# Patient Record
Sex: Male | Born: 1970 | Race: White | Hispanic: No | Marital: Single | Smoking: Current every day smoker
Health system: Southern US, Community
[De-identification: ages and names within clinical notes are randomized; demographics above are authoritative.]

## PROBLEM LIST (undated history)

## (undated) DIAGNOSIS — Z720 Tobacco use: Secondary | ICD-10-CM

## (undated) DIAGNOSIS — K219 Gastro-esophageal reflux disease without esophagitis: Secondary | ICD-10-CM

## (undated) DIAGNOSIS — N400 Enlarged prostate without lower urinary tract symptoms: Secondary | ICD-10-CM

## (undated) DIAGNOSIS — K589 Irritable bowel syndrome without diarrhea: Secondary | ICD-10-CM

## (undated) HISTORY — DX: Irritable bowel syndrome, unspecified: K58.9

## (undated) HISTORY — DX: Irritable bowel syndrome without diarrhea: K58.9

## (undated) HISTORY — DX: Gastro-esophageal reflux disease without esophagitis: K21.9

## (undated) HISTORY — DX: Benign prostatic hyperplasia without lower urinary tract symptoms: N40.0

## (undated) HISTORY — DX: Tobacco use: Z72.0

---

## 1999-02-01 HISTORY — PX: ESOPHAGOGASTRODUODENOSCOPY: SHX1529

## 2009-09-29 ENCOUNTER — Ambulatory Visit: Payer: Self-pay | Admitting: Internal Medicine

## 2009-09-29 DIAGNOSIS — K219 Gastro-esophageal reflux disease without esophagitis: Secondary | ICD-10-CM

## 2009-09-29 DIAGNOSIS — K5900 Constipation, unspecified: Secondary | ICD-10-CM | POA: Insufficient documentation

## 2009-09-29 DIAGNOSIS — E669 Obesity, unspecified: Secondary | ICD-10-CM

## 2009-09-29 DIAGNOSIS — R351 Nocturia: Secondary | ICD-10-CM

## 2009-09-29 DIAGNOSIS — F17200 Nicotine dependence, unspecified, uncomplicated: Secondary | ICD-10-CM

## 2009-09-30 LAB — CONVERTED CEMR LAB
BUN: 20 mg/dL (ref 6–23)
CO2: 29 meq/L (ref 19–32)
Calcium: 9.6 mg/dL (ref 8.4–10.5)
Chloride: 105 meq/L (ref 96–112)
Cholesterol: 184 mg/dL (ref 0–200)
Creatinine, Ser: 1.2 mg/dL (ref 0.4–1.5)
GFR calc non Af Amer: 74.27 mL/min (ref >60)
Glucose, Bld: 92 mg/dL (ref 70–99)
HDL: 37.4 mg/dL — ABNORMAL LOW (ref >39.00)
LDL Cholesterol: 129 mg/dL — ABNORMAL HIGH (ref 0–99)
PSA: 1.7 ng/mL (ref 0.10–4.00)
Potassium: 5 meq/L (ref 3.5–5.1)
Sodium: 141 meq/L (ref 135–145)
TSH: 0.67 u[IU]/mL (ref 0.35–5.50)
Total CHOL/HDL Ratio: 5
Triglycerides: 89 mg/dL (ref 0.0–149.0)
VLDL: 17.8 mg/dL (ref 0.0–40.0)

## 2009-11-11 ENCOUNTER — Ambulatory Visit: Payer: Self-pay | Admitting: Internal Medicine

## 2009-11-11 DIAGNOSIS — R0602 Shortness of breath: Secondary | ICD-10-CM

## 2010-03-02 NOTE — Assessment & Plan Note (Signed)
Summary: FOLLOW UP / LFW   Vital Signs:  Patient profile:   40 year old male Weight:      253 pounds Temp:     98.8 degrees F oral Pulse rate:   76 / minute Pulse rhythm:   regular BP sitting:   126 / 70  (left arm) Cuff size:   large  Vitals Entered By: Selena Batten Dance CMA Duncan Dull) (November 11, 2009 9:16 AM) CC: 2 month follow up   History of Present Illness: CC: 2 mo f/u.  reviewed blood work.  1. bowel - still having episodes of constipation for 2-3 days followed by 1 day of diarrhea with cramping and abd pain (3-5x, loose stool).  No blood in stool.  Hsa had this problem for  ~10 years.    2. obesity - weight loss 10 lbs since last visit, 17 lbs since starting to watch lifestyle.  has quit drinking soft drinks, started eating protein bars, cut out junk food from diet.  thinking about starting exercising.  to start going to gym, start cardio.    3. smoking - tried chantix x 2 wks, made him feel weird, but ran into home issues (stress/anxiety).  increased smoking 1 1/2 ppd.  There is chance of stress resolving in near future.  Is significantly short of breath at baseline.  4. urine - stream improved since lost weight.  prostate was enlarged last visit.  doesn't think needs meds for now.  Rare EtOH, 1 drink at most.  Current Medications (verified): 1)  Miralax  Powd (Polyethylene Glycol 3350) .Marland Kitchen.. 17gm in 8 Oz Fluid As Needed Constipation, Daily  Allergies (verified): No Known Drug Allergies  Past History:  Past Medical History: Last updated: 09/29/2009 GERD ?IBS BPH smoking  Past Surgical History: Last updated: 09/29/2009 none  EGD 2001? WNL per patient  Family History: Last updated: 09/29/2009 PGM - emphysema Father - CAD/MI  Mother - CAD/MI at 25, BRCA s/p mastectomy  No other CA, CVA, DM  Social History: Last updated: 09/29/2009 1ppd smoker 20 PY, social EtOH, no rec drugs Lives with fiancee and 2 children, 1 cat Occupation Education officer, [employer]  [employer] Starting elliptical, considering weight training PMH-FH-SH reviewed for relevance  Review of Systems       per HPI  Physical Exam  General:  Well-developed,well-nourished,in no acute distress; alert,appropriate and cooperative throughout examination Neck:  No deformities, masses, or tenderness noted. Lungs:  very coarse breath sounds, insp and exp rhonchi, no cough, no crackles.  normal WOB. Heart:  Normal rate and regular rhythm. S1 and S2 normal without gallop, murmur, click, rub or other extra sounds. Abdomen:  obese, Bowel sounds positive,abdomen soft and without masses, organomegaly or hernias noted.  + tender to palpation LLQ, no rebound or guarding Pulses:  2+ rad pulses Extremities:  no edema   Impression & Recommendations:  Problem # 1:  DYSPNEA (ICD-786.05) obtain baseline CXR in this longtime smoker with dyspnea.  likely COPD.  trial of advair (sample) to help breathing,  will need spirometry in future.  Orders: T-Acute Abdomen (2 view w/ PA & Chest ([id number])  Problem # 2:  CONSTIPATION (ICD-564.00) alternating with diarrhea and cramps.  Could well have IBS.  abd series with retained stool.  treat as constipation with docusate, miralax for next several days to 1-2 wks, see if any improvmenet with this.  o/w consider levsin or probiotics for ? IBS.  Orders: T-Acute Abdomen (2 view w/ PA & Chest ([id number])  His updated  medication list for this problem includes:    Miralax Powd (Polyethylene glycol 3350) .Marland KitchenMarland KitchenMarland KitchenMarland Kitchen 17gm in 8 oz fluid as needed constipation, daily  Problem # 3:  SMOKER (ICD-305.1) felt "weird" on chantix but tolerated. rec restart chantix when stress level decreases again.  vs just quit on own.    The following medications were removed from the medication list:    Chantix Starting Month Pak 0.5 Mg X 11 & 1 Mg X 42 Tabs (Varenicline tartrate) ..... Use as directed    Chantix Continuing Month Pak 1 Mg Tabs (Varenicline tartrate) ..... Use as  directed  Orders: T-Acute Abdomen (2 view w/ PA & Chest ([id number])  Problem # 4:  OBESITY, UNSPECIFIED (ICD-278.00) 10 lb weight loss since last visit.  continue to encourage exercise and watching diet.    Ht: 76.5 (09/29/2009)   Wt: 253 (11/11/2009)   BMI: 31.74 (09/29/2009)  Problem # 5:  NOCTURIA (AVW-098.11) PSA normal. DRE with enlarged prostate.  pt prefers to hold off on meds for now.  Complete Medication List: 1)  Miralax Powd (Polyethylene glycol 3350) .Marland Kitchen.. 17gm in 8 oz fluid as needed constipation, daily 2)  Advair Diskus 250-50 Mcg/dose Aepb (Fluticasone-salmeterol) .... One puff twice daily  Patient Instructions: 1)  Return in 1-2 months for follow up. 2)  contsipation- miralax. 3)  Good to see you today. 4)  When stress level is down, get back on quititng smoking.  Trial of advair for breathing, one puff twice daily. Prescriptions: MIRALAX  POWD (POLYETHYLENE GLYCOL 3350) 17gm in 8 oz fluid as needed constipation, daily  #1 x 1   Entered and Authorized by:   Eustaquio Boyden  MD   Signed by:   Eustaquio Boyden  MD on 11/11/2009   Method used:   Electronically to        [facility]  [address] [phone]* (retail)       [address]       [place], [place]  [address]       Ph: [phone]       Fax: [phone]   RxID:   [id number] LEVSIN 0.125 MG TABS (HYOSCYAMINE SULFATE) take one tablet two times a day as needed abd cramps  #30 x 0   Entered and Authorized by:   Eustaquio Boyden  MD   Signed by:   Eustaquio Boyden  MD on 11/11/2009   Method used:   Electronically to        [facility]  [address] [phone]* (retail)       [address]       [place], [place]  [address]       Ph: [phone]       Fax: [phone]   RxID:   [id number]   Prior Medications: Current Allergies (reviewed today): No known allergies

## 2010-03-02 NOTE — Assessment & Plan Note (Signed)
Summary: NEW PT TO EST/CPX/CLE   Vital Signs:  Patient profile:   40 year old male Height:      76.5 inches Weight:      263.25 pounds BMI:     31.74 Temp:     98.7 degrees F oral Pulse rate:   76 / minute Pulse rhythm:   regular BP sitting:   138 / 72  (left arm) Cuff size:   large  Vitals Entered By: Selena Batten Dance CMA (AAMA) (September 29, 2009 10:02 AM) CC: New patient to establish care   History of Present Illness: CC:  new patient, issues  1. ? IBS- 5 years now has constipation about once a month, then 2 days later has diarrhea with stomach cramping.  Occurs every 2-6 weeks.  Staying about same.  Thinks stress related.  Constipation - 2 days without stool.  Then diarrhea with crampy abd pain - 4-5 times a day loose stools.  + foul smelling.  No black tarry or red stools.  No blood in stool.  No nausea or vomiting.    2. GERD - 10 years ago had EGD 2/2 GERD, told normal.  Worse with certain foods.  Takes tums.  + heartburn.    3. Urine stream - much weaker than normal.  wakes up 1x with nocturia.  no hesitancy or dribbling.  just weaker than previously.  4. smoking - 20+ years smoking.  1 ppd.  wants chantix.  SOB easily.  has tried wellbutrin in past as well as patches with no improvement.  lost 10 lbs, staying away from fast foods, increasing exercise - elliptical.  Preventive Screening-Counseling & Management  Alcohol-Tobacco     Alcohol drinks/day: <1     Smoking Status: current     Smoking Cessation Counseling: yes     Packs/Day: 1.0  Caffeine-Diet-Exercise     Caffeine use/day: 2 diet mountain dews/day      Drug Use:  never.        Blood Transfusions:  no.    Allergies (verified): No Known Drug Allergies  Past History:  Past Medical History: GERD ?IBS BPH smoking  Past Surgical History: none  EGD 2001? WNL per patient  Family History: PGM - emphysema Father - CAD/MI  Mother - CAD/MI at 43, BRCA s/p mastectomy  No other CA, CVA, DM  Social  History: 1ppd smoker 20 PY, social EtOH, no rec drugs Lives with fiancee and 2 children, 1 cat Occupation Education officer, [employer] Starting elliptical, considering weight trainingSmoking Status:  current Packs/Day:  1.0 Caffeine use/day:  2 diet mountain dews/day Drug Use:  never Blood Transfusions:  no  Review of Systems       The patient complains of weight loss, vision loss, abdominal pain, and severe indigestion/heartburn.  The patient denies anorexia, fever, weight gain, decreased hearing, hoarseness, chest pain, syncope, dyspnea on exertion, peripheral edema, prolonged cough, headaches, hemoptysis, melena, hematochezia, hematuria, muscle weakness, suspicious skin lesions, depression, and testicular masses.         trying to lose weight, vision loss close up  Physical Exam  General:  Well-developed,well-nourished,in no acute distress; alert,appropriate and cooperative throughout examination Head:  Normocephalic and atraumatic without obvious abnormalities. No apparent alopecia or balding. Eyes:  No corneal or conjunctival inflammation noted. EOMI. Perrla. Ears:  External ear exam shows no significant lesions or deformities.  Otoscopic examination reveals clear canals, tympanic membranes are intact bilaterally without bulging, retraction, inflammation or discharge. Hearing is grossly normal bilaterally. Nose:  External  nasal examination shows no deformity or inflammation. Nasal mucosa are pink and moist without lesions or exudates. Mouth:  Oral mucosa and oropharynx without lesions or exudates.  Teeth in good repair. Neck:  No deformities, masses, or tenderness noted. Lungs:  coarse breath sounds, insp and exp rhonchi, no cough, no crackles.  normal WOB. Heart:  Normal rate and regular rhythm. S1 and S2 normal without gallop, murmur, click, rub or other extra sounds. Abdomen:  obese, Bowel sounds positive,abdomen soft and non-tender without masses, organomegaly or hernias  noted. Rectal:  No external abnormalities noted. Normal sphincter tone. No rectal masses or tenderness. Prostate:  Prostate gland firm and smooth, no nodularity, tenderness, mass, asymmetry or induration.  2+ enlargement, about 40gm  Msk:  No deformity or scoliosis noted of thoracic or lumbar spine.   Pulses:  2+ rad pulses Extremities:  no edema Neurologic:  CN grossly intact, station and gait intact Skin:  Intact without suspicious lesions or rashes   Impression & Recommendations:  Problem # 1:  OBESITY, UNSPECIFIED (ICD-278.00) has already lost 10 lbs with diet and exercise changes. encouraged, discussed goal BMI and weight given height. (210 lbs) Orders: TLB-BMP (Basic Metabolic Panel-BMET) (80048-METABOL) TLB-Lipid Panel (80061-LIPID) TLB-TSH (Thyroid Stimulating Hormone) (84443-TSH)  Ht: 76.5 (09/29/2009)   Wt: 263.25 (09/29/2009)   BMI: 31.74 (09/29/2009)  Problem # 2:  NOCTURIA (XBJ-478.29) check PSA.  DRE with enlarged prostate.  If normal, rec start flomax, discuss 5a Reductase inhibitors. Orders: TLB-PSA (Prostate Specific Antigen) (84153-PSA)  Problem # 3:  SMOKER (ICD-305.1) Assessment: Unchanged encouraged cessation.  pt wants chantix, discussed common side effects.  discussed quit date 1 wk into med.  discussed cost, provided with quitline[place] website  His updated medication list for this problem includes:    Chantix Starting Month Pak 0.5 Mg X 11 & 1 Mg X 42 Tabs (Varenicline tartrate) ..... Use as directed    Chantix Continuing Month Pak 1 Mg Tabs (Varenicline tartrate) ..... Use as directed  Problem # 4:  CONSTIPATION (ICD-564.00) alternating with diarrhea and cramps.  Could well have IBS.  Discussed dietary fiber measures and increased water intake.  RTC 1-2 months for f/u, if no improvement with diet changes, consider levsin for IBS dx.    Problem # 5:  GERD (ICD-530.81) Discussed lifestyle modifications, diet, antacids/medications, and preventive measures.   trial of 1 mo daily PPI.    His updated medication list for this problem includes:    Omeprazole 40 Mg Cpdr (Omeprazole) ..... One daily for 4 weeks  Problem # 6:  Preventive Health Care (ICD-V70.0) flu shot today.  ? due for tetanus shot  Complete Medication List: 1)  Omeprazole 40 Mg Cpdr (Omeprazole) .... One daily for 4 weeks 2)  Chantix Starting Month Pak 0.5 Mg X 11 & 1 Mg X 42 Tabs (Varenicline tartrate) .... Use as directed 3)  Chantix Continuing Month Pak 1 Mg Tabs (Varenicline tartrate) .... Use as directed  Other Orders: Admin 1st Vaccine (56213) Flu Vaccine 2yrs + (08657)  Patient Instructions: 1)  Return in 1-2 months for follow up. 2)  Pleasure to meet you today.   3)  Chantix for smoking, set quit date 1 week into script.  [email or web address] for support. 4)  Flu shot today. 5)  For Reflux: 6)  Omeprazole 40mg  daily for 4 wks. 7)  Head of bed elevated. 8)  Avoidance of citrus, fatty foods, chocolate, peppermint, and excessive alcohol, along with sodas, orange juice (acidic drinks) 9)  At least a  few hours between dinner and bed, minimize naps after eating. 10)  No smoking.  11)  For Stools: 12)  increase fiber in diet 13)  We will keep an eye on your prostate - prostate check today. Prescriptions: CHANTIX CONTINUING MONTH PAK 1 MG TABS (VARENICLINE TARTRATE) use as directed  #1 x 1   Entered and Authorized by:   Eustaquio Boyden  MD   Signed by:   Eustaquio Boyden  MD on 09/29/2009   Method used:   Electronically to        [facility] [phone]* (retail)       [address]       [place], [place]  [address]       Ph: [phone]       Fax: [phone]   RxID:   [id number] CHANTIX STARTING MONTH PAK 0.5 MG X 11 & 1 MG X 42 TABS (VARENICLINE TARTRATE) use as directed  #1 x 0   Entered and Authorized by:   Eustaquio Boyden  MD   Signed by:   Eustaquio Boyden  MD on 09/29/2009   Method used:   Electronically to        [facility]  [phone]* (retail)       [address]       [place], [place]  [address]       Ph: [phone]       Fax: [phone]   RxID:   [id number] OMEPRAZOLE 40 MG CPDR (OMEPRAZOLE) one daily for 4 weeks  #30 x 0   Entered and Authorized by:   Eustaquio Boyden  MD   Signed by:   Eustaquio Boyden  MD on 09/29/2009   Method used:   Electronically to        [facility] [phone]* (retail)       [address]       [place], [place]  [address]       Ph: [phone]       Fax: [phone]   RxID:   [id number]   Flu Vaccine Consent Questions     Do you have a history of severe allergic reactions to this vaccine? no    Any prior history of allergic reactions to egg and/or gelatin? no    Do you have a sensitivity to the preservative Thimersol? no    Do you have a past history of Guillan-Barre Syndrome? no    Do you currently have an acute febrile illness? no    Have you ever had a severe reaction to latex? no    Vaccine information given and explained to patient? yes    Are you currently pregnant? no    Lot Number:[id number]   Exp Date:07/31/2010   Site Given  Left Deltoid IM Kim Dance CMA (AAMA)  September 29, 2009 11:06 AM   Prior Medications: Current Allergies (reviewed today): No known allergies       Prevention & Chronic Care Immunizations   Influenza vaccine: Fluvax 3+  (09/29/2009)   Influenza vaccine due: 10/02/2010    Tetanus booster: Not documented    Pneumococcal vaccine: Not documented  Other Screening   Smoking status: current  (09/29/2009)   Smoking cessation counseling: yes  (09/29/2009)  Lipids   Total Cholesterol: Not documented   LDL: Not documented  LDL Direct: Not documented   HDL: Not documented   Triglycerides: Not documented

## 2010-09-01 ENCOUNTER — Encounter: Payer: Self-pay | Admitting: Family Medicine

## 2010-09-02 ENCOUNTER — Encounter: Payer: Self-pay | Admitting: Family Medicine

## 2010-09-02 ENCOUNTER — Ambulatory Visit (INDEPENDENT_AMBULATORY_CARE_PROVIDER_SITE_OTHER): Payer: BC Managed Care – PPO | Admitting: Family Medicine

## 2010-09-02 VITALS — BP 110/60 | HR 76 | Temp 98.7°F | Wt 234.0 lb

## 2010-09-02 DIAGNOSIS — F659 Paraphilia, unspecified: Secondary | ICD-10-CM

## 2010-09-02 DIAGNOSIS — E785 Hyperlipidemia, unspecified: Secondary | ICD-10-CM

## 2010-09-02 DIAGNOSIS — N400 Enlarged prostate without lower urinary tract symptoms: Secondary | ICD-10-CM

## 2010-09-02 DIAGNOSIS — F17200 Nicotine dependence, unspecified, uncomplicated: Secondary | ICD-10-CM

## 2010-09-02 DIAGNOSIS — R0602 Shortness of breath: Secondary | ICD-10-CM

## 2010-09-02 DIAGNOSIS — R6882 Decreased libido: Secondary | ICD-10-CM

## 2010-09-02 MED ORDER — ALBUTEROL 90 MCG/ACT IN AERS: 2.0000 | INHALATION_SPRAY | Freq: Four times a day (QID) | RESPIRATORY_TRACT | Status: AC | PRN

## 2010-09-02 NOTE — Assessment & Plan Note (Signed)
Check PSA. H/o enlarged prostate in past, pt states sxs tolerable currently, declines meds. Actually improved with weight loss.

## 2010-09-02 NOTE — Assessment & Plan Note (Signed)
Check FLP this week when returns fasting.

## 2010-09-02 NOTE — Patient Instructions (Addendum)
Return at your convenience fasting for blood work - we will contact you with results. Checking cholesterol levels again, testosterone, prostate level, and blood count. Return for spirometry - check with Selena Batten for this.  Based on what lung function shows, we will discuss starting medicine. Good to see you today, call us with questions.

## 2010-09-02 NOTE — Progress Notes (Signed)
Subjective:    Patient ID: Larry Conner, male    DOB: 11/11/1970, 40 y.o.   MRN: [id number]  HPI CC: prostate and breathing  Since last seen, lost 40lbs, increased activity at gym.  Changed diet as well. Wt Readings from Last 3 Encounters:  09/02/10 234 lb (106.142 kg)  11/11/09 253 lb (114.76 kg)  09/29/09 263 lb 4 oz (119.409 kg)   H/o BPH - with weak stream.  Felt improved with weight loss.  Nocturia x1.  Declines meds to treat BPH, doesn't feel currently an issue. Lab Results  Component Value Date   PSA 1.70 09/29/2009   Libido problems - decreased desire.  Feeling 1-2x/wk sex is enough for pt, not enough for wife.  Less firm erections than in years past, no problems with orgasm.  Started DAA, feels helped some.  Denies depression, anhedonia.  Energy level actually improved with weight loss.  Stress level down.  Dyspnea - worse with exhertion.  Especially notes at gym when working out.  Smoker.  Advair sample trial didn't really help.  No chest pain, tightness, HA, leg swelling, dizziness.  Smoking - continues to cut down.  5-10/day.  Started e cigarettes.  No fmhx prostate cancer.  No blood in urine.  No dysuria, urgency, frequency.  No fevers/chills.  Lab Results  Component Value Date   CHOL 184 09/29/2009   Lab Results  Component Value Date   HDL 37.40* 09/29/2009   Lab Results  Component Value Date   LDLCALC 129* 09/29/2009   Lab Results  Component Value Date   TRIG 89.0 09/29/2009   Medications and allergies reviewed and updated in chart. Patient Active Problem List  Diagnoses  . OBESITY, UNSPECIFIED  . SMOKER  . GERD  . CONSTIPATION  . DYSPNEA  . NOCTURIA  . BPH (benign prostatic hypertrophy)  . Decreased sex drive  . Dyslipidemia   Past Medical History  Diagnosis Date  . GERD (gastroesophageal reflux disease)   . IBS (irritable bowel syndrome)   . BPH (benign prostatic hypertrophy)   . Tobacco abuse    Past Surgical History  Procedure Date  .  Esophagogastroduodenoscopy 2001    WNL per patient   History  Substance Use Topics  . Smoking status: Current Everyday Smoker -- 0.3 packs/day for 20 years    Types: Cigarettes  . Smokeless tobacco: Not on file  . Alcohol Use: Yes     Socially   Family History  Problem Relation Age of Onset  . Emphysema Paternal Grandmother   . Coronary artery disease Father   . Coronary artery disease Mother 61    MI  . Breast cancer Mother     s/p mastectomy   No Known Allergies Current Outpatient Prescriptions on File Prior to Visit  Medication Sig Dispense Refill  . polyethylene glycol (MIRALAX / GLYCOLAX) packet Take 17 g by mouth daily as needed.         Review of Systems per HPI    Objective:   Physical Exam  Nursing note and vitals reviewed. Constitutional: He appears well-developed and well-nourished. No distress.  HENT:  Head: Normocephalic and atraumatic.  Mouth/Throat: Oropharynx is clear and moist. No oropharyngeal exudate.  Eyes: Conjunctivae and EOM are normal. Pupils are equal, round, and reactive to light. No scleral icterus.  Neck: Normal range of motion. Neck supple.  Cardiovascular: Normal rate, regular rhythm, normal heart sounds and intact distal pulses.   No murmur heard. Pulmonary/Chest: Effort normal. No respiratory distress. He has  wheezes (bilateral rhonchi). He has no rales.       Coarse throughout  Musculoskeletal: He exhibits no edema.  Lymphadenopathy:    He has no cervical adenopathy.  Skin: Skin is warm and dry. No rash noted.  Psychiatric: He has a normal mood and affect.          Assessment & Plan:

## 2010-09-02 NOTE — Assessment & Plan Note (Addendum)
Anticipate component of COPD. CBC to r/o anemia as cause. Return for spirometry pre and post albuterol. Currently machine not functioning. CXR 10/2009 with mild hyperinflation.

## 2010-09-02 NOTE — Assessment & Plan Note (Signed)
Continue to encourage cessation. 

## 2010-09-02 NOTE — Assessment & Plan Note (Signed)
Check testosterone. Update on results.  If low, referral to urology for further eval/treatment. Discussed worsening of BPH if testosterone supplements. rec avoid DAA.

## 2010-09-03 ENCOUNTER — Other Ambulatory Visit (INDEPENDENT_AMBULATORY_CARE_PROVIDER_SITE_OTHER): Payer: BC Managed Care – PPO | Admitting: Family Medicine

## 2010-09-03 DIAGNOSIS — F659 Paraphilia, unspecified: Secondary | ICD-10-CM

## 2010-09-03 DIAGNOSIS — R6882 Decreased libido: Secondary | ICD-10-CM

## 2010-09-03 DIAGNOSIS — N400 Enlarged prostate without lower urinary tract symptoms: Secondary | ICD-10-CM

## 2010-09-03 DIAGNOSIS — R0602 Shortness of breath: Secondary | ICD-10-CM

## 2010-09-03 DIAGNOSIS — E785 Hyperlipidemia, unspecified: Secondary | ICD-10-CM

## 2010-09-03 LAB — CBC WITH DIFFERENTIAL/PLATELET
Basophils Absolute: 0 10*3/uL (ref 0.0–0.1)
Eosinophils Absolute: 0.1 10*3/uL (ref 0.0–0.7)
HCT: 42 % (ref 39.0–52.0)
Lymphocytes Relative: 26.4 % (ref 12.0–46.0)
Lymphs Abs: 1.6 10*3/uL (ref 0.7–4.0)
MCHC: 33.5 g/dL (ref 30.0–36.0)
Monocytes Relative: 10.5 % (ref 3.0–12.0)
Platelets: 289 10*3/uL (ref 150.0–400.0)
RDW: 13.3 % (ref 11.5–14.6)

## 2010-09-03 LAB — LIPID PANEL
HDL: 42.9 mg/dL (ref >39.00)
VLDL: 9.4 mg/dL (ref 0.0–40.0)

## 2010-09-03 LAB — PSA: PSA: 1.35 ng/mL (ref 0.10–4.00)

## 2010-09-06 LAB — TESTOSTERONE, FREE, TOTAL, SHBG
Testosterone-% Free: 2 % (ref 1.6–2.9)
Testosterone: 446.71 ng/dL (ref 250–890)

## 2010-10-30 IMAGING — CR DG ABDOMEN ACUTE W/ 1V CHEST
3 series · 3 of 3 positions shown · non-contrast
Comparison: None.

CLINICAL DATA: Shortness of breath, smoker, constipation

ACUTE ABDOMEN SERIES (ABDOMEN 2 VIEW & CHEST 1 VIEW)

[view not recorded (1 of 3)]
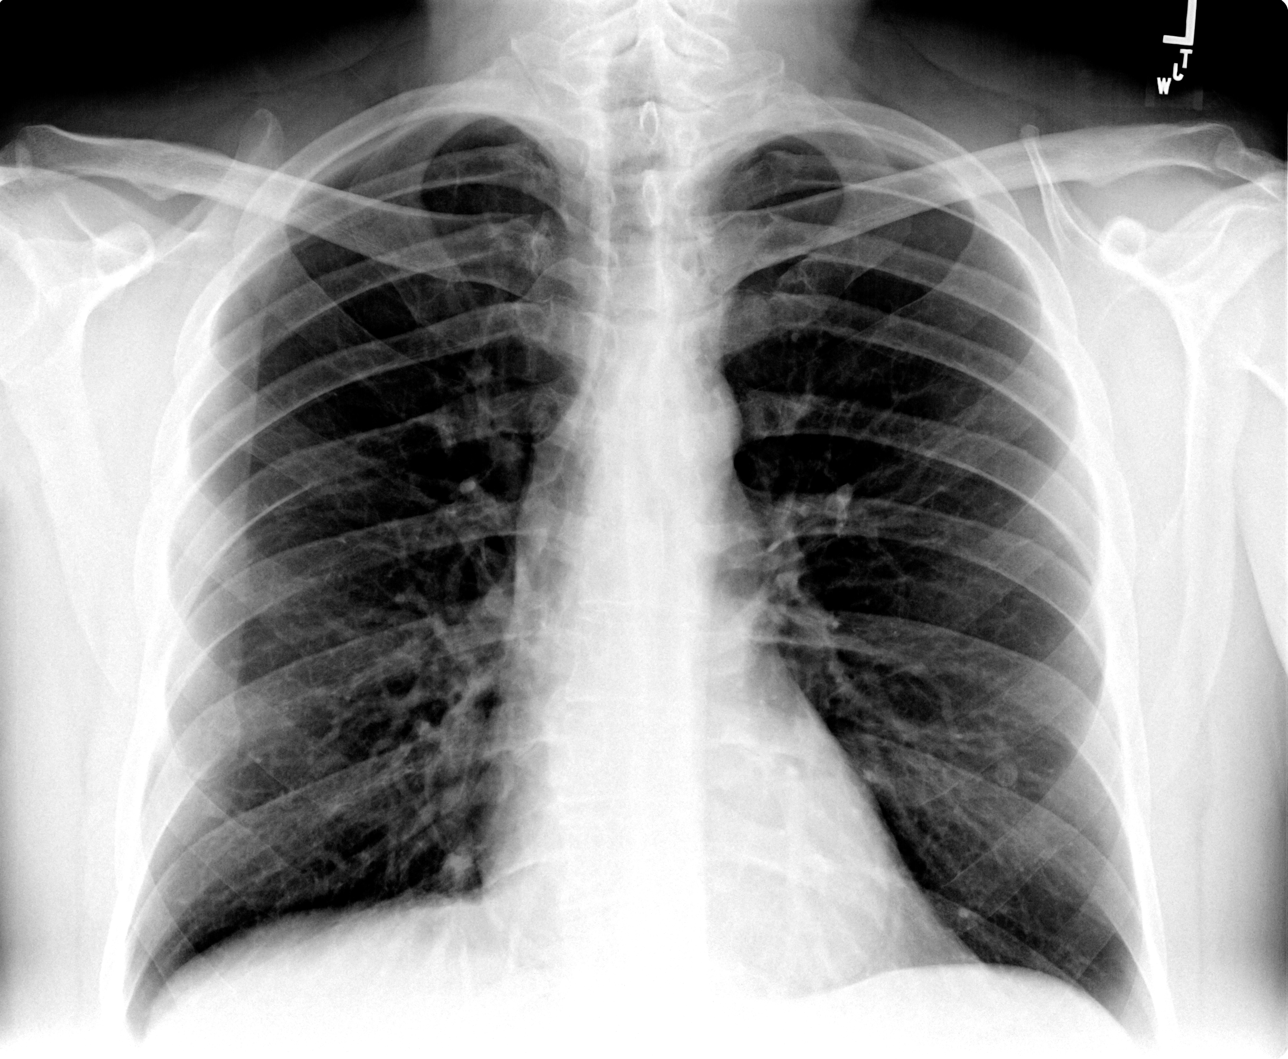

[view not recorded (2 of 3)]
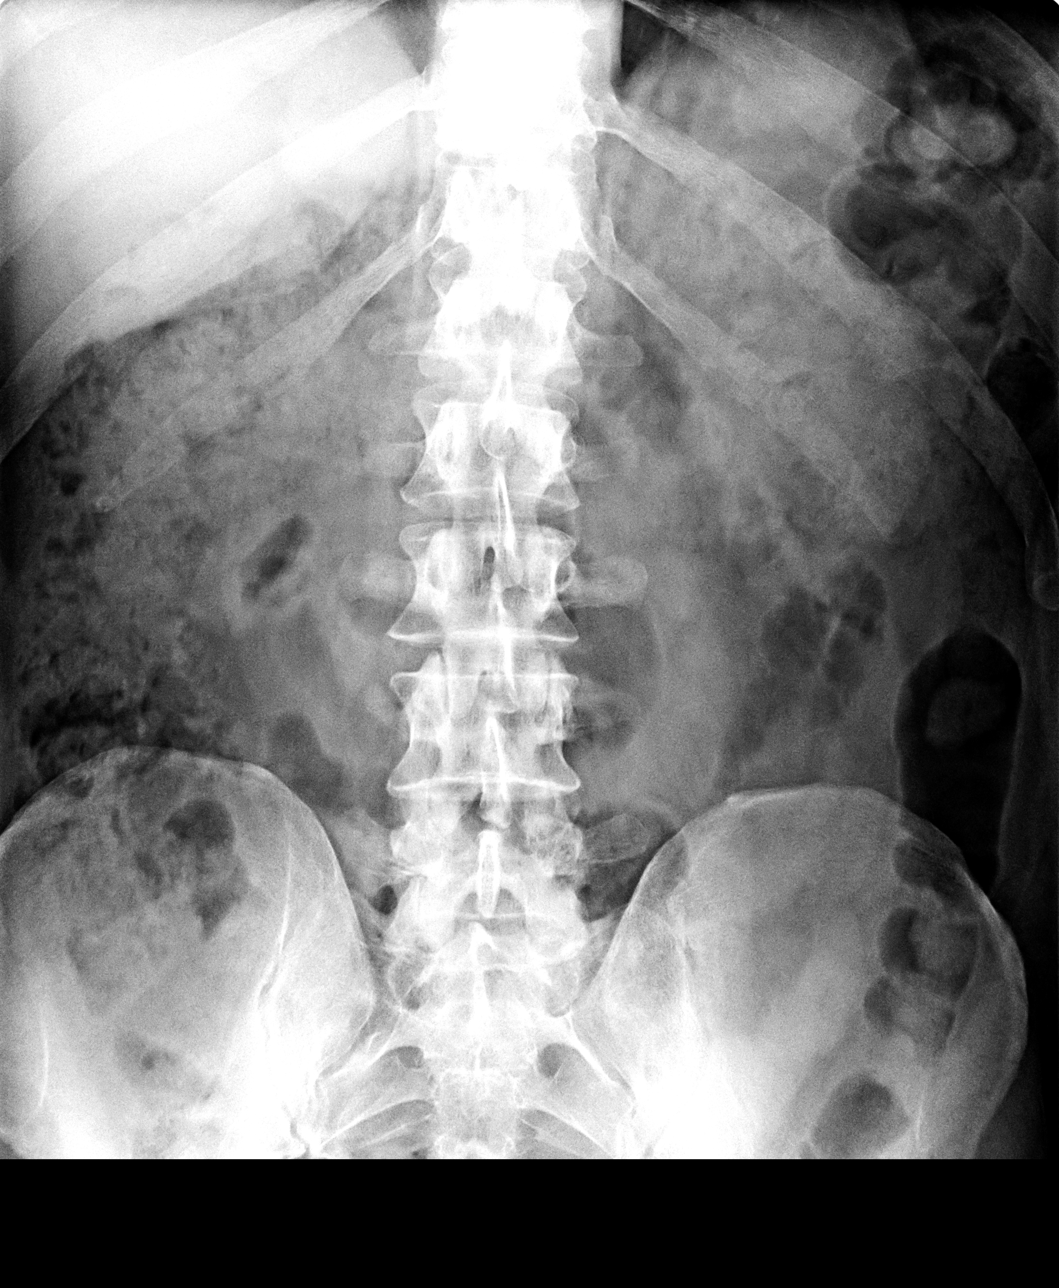

[view not recorded (3 of 3)]
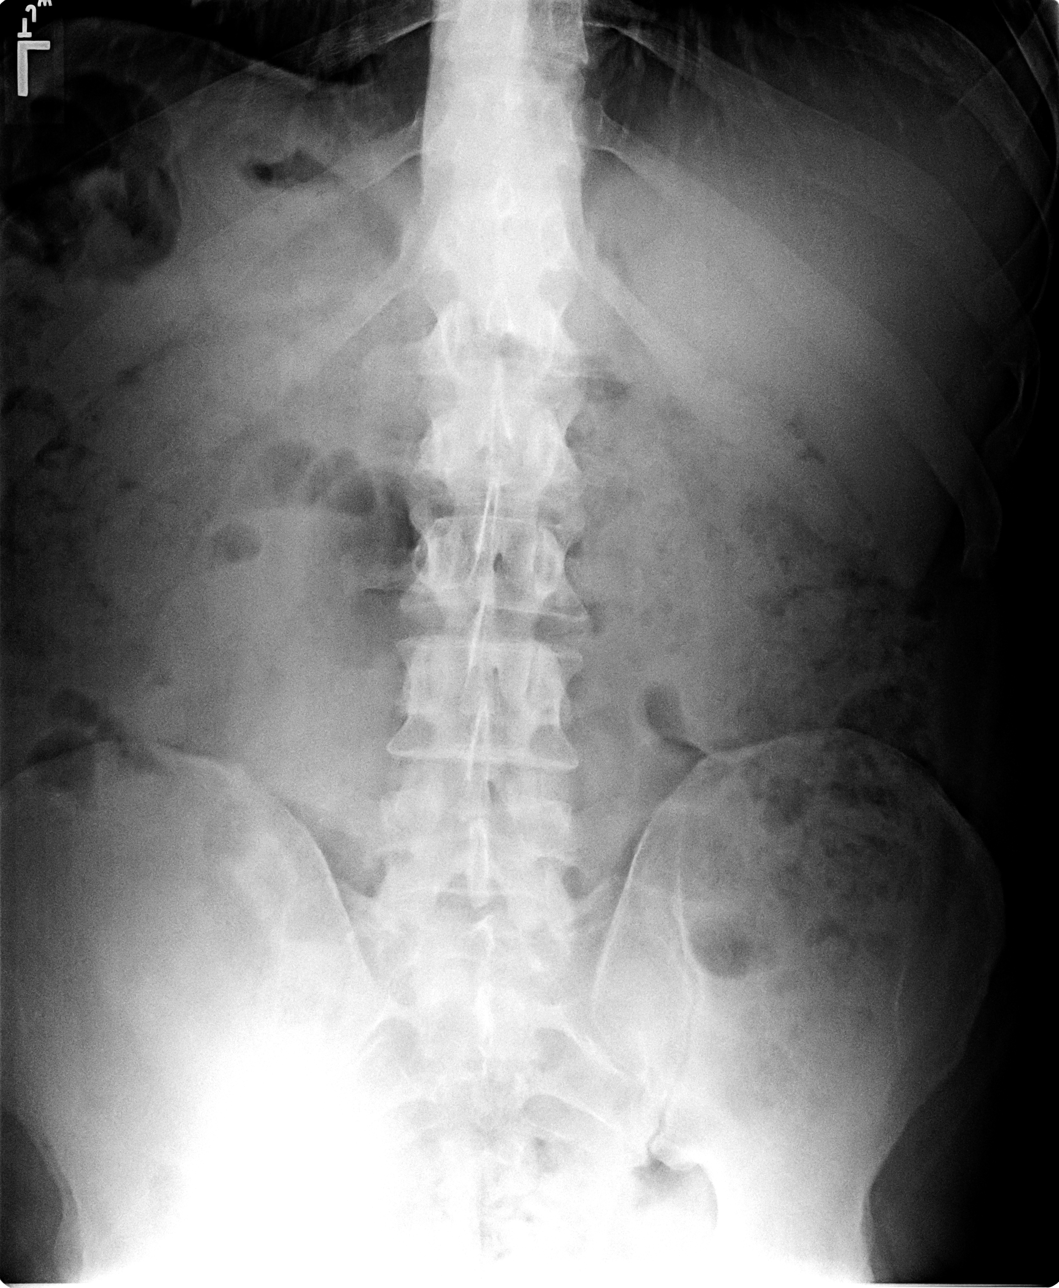

[3 of 3 positions shown; findings below may reference images not displayed]

FINDINGS: Mild hyperinflation.  Negative for pneumonia, edema,
effusion or pneumothorax.  Symmetric lower chest nipple shadows
noted.  Midline trachea.  Normal heart size and vascularity.

Nonobstructive bowel gas pattern.  Moderate retained stool
throughout the colon.  No abnormal calcifications.  No significant
dilatation, obstruction pattern or ileus.
IMPRESSION: No acute chest or abdominal process.  Moderate retained stool
throughout the colon compatible with constipation.

## 2021-07-06 ENCOUNTER — Ambulatory Visit (INDEPENDENT_AMBULATORY_CARE_PROVIDER_SITE_OTHER): Payer: No Typology Code available for payment source

## 2021-07-06 ENCOUNTER — Ambulatory Visit
Admission: EM | Admit: 2021-07-06 | Discharge: 2021-07-06 | Disposition: A | Discharge status: Home / Self Care | Payer: No Typology Code available for payment source

## 2021-07-06 DIAGNOSIS — J45901 Unspecified asthma with (acute) exacerbation: Secondary | ICD-10-CM | POA: Diagnosis not present

## 2021-07-06 DIAGNOSIS — R509 Fever, unspecified: Secondary | ICD-10-CM

## 2021-07-06 DIAGNOSIS — R059 Cough, unspecified: Secondary | ICD-10-CM | POA: Diagnosis not present

## 2021-07-06 DIAGNOSIS — R0902 Hypoxemia: Secondary | ICD-10-CM | POA: Diagnosis not present

## 2021-07-06 DIAGNOSIS — R0602 Shortness of breath: Secondary | ICD-10-CM

## 2021-07-06 DIAGNOSIS — R051 Acute cough: Secondary | ICD-10-CM

## 2021-07-06 DIAGNOSIS — F17200 Nicotine dependence, unspecified, uncomplicated: Secondary | ICD-10-CM

## 2021-07-06 MED ORDER — IPRATROPIUM-ALBUTEROL 0.5-2.5 (3) MG/3ML IN SOLN
3.0000 mL | Freq: Once | RESPIRATORY_TRACT | Status: AC
  Administered 2021-07-06: 3 mL via RESPIRATORY_TRACT

## 2021-07-06 NOTE — Discharge Instructions (Addendum)

## 2021-07-06 NOTE — ED Provider Notes (Addendum)
[facility]    CSN: [id number] Arrival date & time: 07/06/21  1312      History   Chief Complaint Chief Complaint  Patient presents with   Cough   Shortness of Breath    HPI Larry Conner is a 51 y.o. male with history of asthma and tobacco abuse.  Patient presents today for feeling ill for the past 3 days.  Reports fatigue, cough, feeling feverish and having shortness of breath.  Patient was out of town to [place] over the weekend for a jeep event.  Patient declines to have flu or COVID test.  Patient denies associated headaches, sore throat, sinus pain, chest pain, abdominal pain, nausea/vomiting or diarrhea.  Patient has been using his Advair inhaler and albuterol.  Has not been using it today and oxygen ranges from 88 to 92%.  When he speaks it goes down to 88% and he gets into coughing fits.  He also says that it is possible he could have COPD and he does not know the difference between COPD and asthma. No history of PE.   HPI  Past Medical History:  Diagnosis Date   BPH (benign prostatic hypertrophy)    GERD (gastroesophageal reflux disease)    IBS (irritable bowel syndrome)    Tobacco abuse     Patient Active Problem List   Diagnosis Date Noted   BPH (benign prostatic hypertrophy) 09/02/2010   Decreased sex drive 95/62/1308   Dyslipidemia 09/02/2010   DYSPNEA 11/11/2009   OBESITY, UNSPECIFIED 09/29/2009   SMOKER 09/29/2009   GERD 09/29/2009   CONSTIPATION 09/29/2009   NOCTURIA 09/29/2009    Past Surgical History:  Procedure Laterality Date   ESOPHAGOGASTRODUODENOSCOPY  2001   WNL per patient       Home Medications    Prior to Admission medications   Medication Sig Start Date End Date Taking? Authorizing Provider  ADVAIR DISKUS 250-50 MCG/ACT AEPB Inhale 1 puff into the lungs 2 (two) times daily. 05/18/21  Yes [provider]  polyethylene glycol (MIRALAX / GLYCOLAX) packet Take 17 g by mouth daily as needed.     Yes [provider]  tamsulosin (FLOMAX) 0.4 MG CAPS capsule Take 0.8 mg by mouth daily. 03/23/21  Yes [provider]  albuterol (PROVENTIL,VENTOLIN) 90 MCG/ACT inhaler Inhale 2 puffs into the lungs every 6 (six) hours as needed for wheezing. 09/02/10 08/28/11  Eustaquio Boyden, MD    Family History Family History  Problem Relation Age of Onset   Emphysema Paternal Grandmother    Coronary artery disease Father    Coronary artery disease Mother 76       MI   Breast cancer Mother        s/p mastectomy    Social History Social History   Tobacco Use   Smoking status: Every Day    Packs/day: 0.30    Years: 20.00    Pack years: 6.00    Types: Cigarettes  Vaping Use   Vaping Use: Never used  Substance Use Topics   Alcohol use: Not Currently    Comment: Socially   Drug use: No     Allergies   Patient has no known allergies.   Review of Systems Review of Systems  Constitutional:  Positive for diaphoresis, fatigue and fever.  HENT:  Positive for congestion and rhinorrhea. Negative for sore throat.   Respiratory:  Positive for cough and shortness of breath.   Cardiovascular:  Negative for chest pain.  Gastrointestinal:  Negative for abdominal  pain, diarrhea, nausea and vomiting.  Musculoskeletal:  Positive for myalgias.  Neurological:  Negative for weakness, light-headedness and headaches.    Physical Exam Triage Vital Signs ED Triage Vitals  Enc Vitals Group     BP 07/06/21 1325 137/77     Pulse Rate 07/06/21 1325 79     Resp 07/06/21 1325 18     Temp 07/06/21 1325 (!) 100.8 F (38.2 C)     Temp Source 07/06/21 1325 Oral     SpO2 07/06/21 1325 90 %     Weight 07/06/21 1323 275 lb (124.7 kg)     Height 07/06/21 1323 6\' 5"  (1.956 m)     Head Circumference --      Peak Flow --      Pain Score 07/06/21 1322 7     Pain Loc --      Pain Edu? --      Excl. in GC? --    No data found.  Updated Vital Signs BP 137/77 (BP Location: Left Arm)   Pulse 79   Temp  (!) 100.8 F (38.2 C) (Oral)   Resp 18   Ht 6\' 5"  (1.956 m)   Wt 275 lb (124.7 kg)   SpO2 90%   BMI 32.61 kg/m      Physical Exam Vitals and nursing note reviewed.  Constitutional:      General: He is not in acute distress.    Appearance: Normal appearance. He is well-developed. He is ill-appearing.  HENT:     Head: Normocephalic and atraumatic.     Nose: Congestion present.     Mouth/Throat:     Mouth: Mucous membranes are moist.     Pharynx: Oropharynx is clear.  Eyes:     General: No scleral icterus.    Conjunctiva/sclera: Conjunctivae normal.  Cardiovascular:     Rate and Rhythm: Normal rate and regular rhythm.  Pulmonary:     Effort: Respiratory distress (pauses when talking at times to catch breath) present.     Breath sounds: Wheezing and rhonchi present.  Musculoskeletal:     Cervical back: Neck supple.  Skin:    General: Skin is warm and dry.     Capillary Refill: Capillary refill takes less than 2 seconds.  Neurological:     General: No focal deficit present.     Mental Status: He is alert. Mental status is at baseline.     Motor: No weakness.     Coordination: Coordination normal.     Gait: Gait normal.  Psychiatric:        Mood and Affect: Mood normal.        Behavior: Behavior normal.        Thought Content: Thought content normal.     UC Treatments / Results  Labs (all labs ordered are listed, but only abnormal results are displayed) Labs Reviewed - No data to display  EKG   Radiology DG Chest 2 View  Result Date: 07/06/2021 CLINICAL DATA:  Cough, fever EXAM: CHEST - 2 VIEW COMPARISON:  11/11/2009 FINDINGS: The heart size and mediastinal contours are within normal limits. Both lungs are clear. The visualized skeletal structures are unremarkable. IMPRESSION: No active cardiopulmonary disease. Electronically Signed   By: 09/05/2021 D.O.   On: 07/06/2021 14:06    Procedures Procedures (including critical care time)  Medications Ordered in  UC Medications  ipratropium-albuterol (DUONEB) 0.5-2.5 (3) MG/3ML nebulizer solution 3 mL (3 mLs Nebulization Given 07/06/21 1407)    Initial Impression /  Assessment and Plan / UC Course  I have reviewed the triage vital signs and the nursing notes.  Pertinent labs & imaging results that were available during my care of the patient were reviewed by me and considered in my medical decision making (see chart for details).  51 year old male with history of asthma and tobacco abuse presents for fever, cough, congestion, fatigue and body aches for the past 3 days.  Patient was at a large deep event over the weekend in [place].  Temp elevated at 100.8 degrees.  O2 saturation ranges between 88% to 91%.  Patient is ill-appearing.  Nasal congestion present.  Diffuse wheezing and rhonchi throughout all lung fields.  Patient frequently pauses when speaking and also gets into coughing fits during evaluation.  Patient given albuterol treatment.  Patient says oxygen increased to 95% when he was wearing the oxygen sensor while having the breathing treatment.  Within a couple minutes after the breathing treatment oxygen went down to 87 to 88%.  Chest x-ray obtained to assess for possible underlying pneumonia.  Chest x-ray reviewed by me.  Overread confirms no evidence of pneumonia and patient has declined COVID and flu testing.  Discussed results of the chest x-ray with him.  Advised patient that I am concerned with his oxygen being so low and no significant improvements made with the albuterol.  Advised that he needs further work-up in the emergency department at this time.  He also would likely benefit from serial breathing treatments, corticosteroids, possibly further imaging on viral panel.  Patient seems reluctant to go to the emergency department.  I did advise him that if he does not go this can lead him to respiratory failure and possibly even death if it is untreated.  Family member with patient tries to  urged [facility] [place] at this time.  He seems agreeable and leaving in stable condition.   Final Clinical Impressions(s) / UC Diagnoses   Final diagnoses:  Hypoxia  Shortness of breath  Fever, unspecified  Acute cough  Asthma with acute exacerbation, unspecified asthma severity, unspecified whether persistent  Tobacco use disorder     Discharge Instructions      You have been advised to follow up immediately in the emergency department for concerning signs.symptoms. If you declined EMS transport, please have a family member take you directly to the ED at this time. Do not delay. Based on concerns about condition, if you do not follow up in th e ED, you may risk poor outcomes including worsening of condition, delayed treatment and potentially life threatening issues. If you have declined to go to the ED at this time, you should call your PCP immediately to set up a follow up appointment.  Go to ED for red flag symptoms, including; fevers you cannot reduce with Tylenol/Motrin, severe headaches, vision changes, numbness/weakness in part of the body, lethargy, confusion, intractable vomiting, severe dehydration, chest pain, breathing difficulty, severe persistent abdominal or pelvic pain, signs of severe infection (increased redness, swelling of an area), feeling faint or passing out, dizziness, etc. You should especially go to the ED for sudden acute worsening of condition if you do not elect to go at this time.      ED Prescriptions   None    PDMP not reviewed this encounter.   Shirlee Latchaves, Edan Juday B, PA-C 07/06/21 1434    Eusebio FriendlyEaves, Ariele Vidrio B, PA-C 07/06/21 1725

## 2021-07-06 NOTE — ED Triage Notes (Signed)
Pt c/o Heat stroke on Saturday in [place].  Pt has had cough, SOB, and fatigue since then.  Pt states that he has asthma.   Pt declines. any covid or flu testing.

## 2024-02-14 ENCOUNTER — Emergency Department

## 2024-02-14 ENCOUNTER — Other Ambulatory Visit: Payer: Self-pay

## 2024-02-14 ENCOUNTER — Encounter: Payer: Self-pay | Admitting: Emergency Medicine

## 2024-02-14 ENCOUNTER — Emergency Department
Admission: EM | Admit: 2024-02-14 | Discharge: 2024-02-14 | Disposition: A | Discharge status: Home / Self Care | Attending: Emergency Medicine | Admitting: Emergency Medicine

## 2024-02-14 DIAGNOSIS — F17200 Nicotine dependence, unspecified, uncomplicated: Secondary | ICD-10-CM | POA: Diagnosis not present

## 2024-02-14 DIAGNOSIS — K219 Gastro-esophageal reflux disease without esophagitis: Secondary | ICD-10-CM | POA: Diagnosis not present

## 2024-02-14 DIAGNOSIS — R112 Nausea with vomiting, unspecified: Secondary | ICD-10-CM | POA: Insufficient documentation

## 2024-02-14 DIAGNOSIS — R1011 Right upper quadrant pain: Secondary | ICD-10-CM | POA: Insufficient documentation

## 2024-02-14 DIAGNOSIS — R1013 Epigastric pain: Secondary | ICD-10-CM | POA: Diagnosis not present

## 2024-02-14 DIAGNOSIS — R42 Dizziness and giddiness: Secondary | ICD-10-CM | POA: Insufficient documentation

## 2024-02-14 DIAGNOSIS — R61 Generalized hyperhidrosis: Secondary | ICD-10-CM | POA: Insufficient documentation

## 2024-02-14 DIAGNOSIS — R55 Syncope and collapse: Secondary | ICD-10-CM | POA: Diagnosis not present

## 2024-02-14 LAB — CBC WITH DIFFERENTIAL/PLATELET
Abs Immature Granulocytes: 0.06 K/uL (ref 0.00–0.07)
Basophils Absolute: 0 K/uL (ref 0.0–0.1)
Basophils Relative: 0 %
Eosinophils Absolute: 0.1 K/uL (ref 0.0–0.5)
Eosinophils Relative: 1 %
HCT: 42.6 % (ref 39.0–52.0)
Hemoglobin: 14.5 g/dL (ref 13.0–17.0)
Immature Granulocytes: 1 %
Lymphocytes Relative: 15 %
Lymphs Abs: 1.9 K/uL (ref 0.7–4.0)
MCH: 29.8 pg (ref 26.0–34.0)
MCHC: 34 g/dL (ref 30.0–36.0)
MCV: 87.5 fL (ref 80.0–100.0)
Monocytes Absolute: 0.8 K/uL (ref 0.1–1.0)
Monocytes Relative: 7 %
Neutro Abs: 9.4 K/uL — ABNORMAL HIGH (ref 1.7–7.7)
Neutrophils Relative %: 76 %
Platelets: 359 K/uL (ref 150–400)
RBC: 4.87 MIL/uL (ref 4.22–5.81)
RDW: 13.2 % (ref 11.5–15.5)
WBC: 12.2 K/uL — ABNORMAL HIGH (ref 4.0–10.5)
nRBC: 0 % (ref 0.0–0.2)

## 2024-02-14 LAB — COMPREHENSIVE METABOLIC PANEL WITH GFR
ALT: 17 U/L (ref 0–44)
AST: 14 U/L — ABNORMAL LOW (ref 15–41)
Albumin: 4.4 g/dL (ref 3.5–5.0)
Alkaline Phosphatase: 54 U/L (ref 38–126)
Anion gap: 14 (ref 5–15)
BUN: 25 mg/dL — ABNORMAL HIGH (ref 6–20)
CO2: 23 mmol/L (ref 22–32)
Calcium: 9.8 mg/dL (ref 8.9–10.3)
Chloride: 104 mmol/L (ref 98–111)
Creatinine, Ser: 1.35 mg/dL — ABNORMAL HIGH (ref 0.61–1.24)
GFR, Estimated: >60 mL/min
Glucose, Bld: 150 mg/dL — ABNORMAL HIGH (ref 70–99)
Potassium: 3.4 mmol/L — ABNORMAL LOW (ref 3.5–5.1)
Sodium: 141 mmol/L (ref 135–145)
Total Bilirubin: 0.3 mg/dL (ref 0.0–1.2)
Total Protein: 6.7 g/dL (ref 6.5–8.1)

## 2024-02-14 LAB — TROPONIN T, HIGH SENSITIVITY: Troponin T High Sensitivity: 19 ng/L (ref 0–19)

## 2024-02-14 LAB — LIPASE, BLOOD: Lipase: 34 U/L (ref 11–51)

## 2024-02-14 MED ORDER — ONDANSETRON 4 MG PO TBDP: 4.0000 mg | ORAL_TABLET | Freq: Four times a day (QID) | ORAL | 0 refills | Status: AC | PRN

## 2024-02-14 MED ORDER — ONDANSETRON HCL 4 MG/2ML IJ SOLN
4.0000 mg | Freq: Once | INTRAMUSCULAR | Status: AC
  Administered 2024-02-14: 4 mg via INTRAVENOUS
  Filled 2024-02-14: qty 2

## 2024-02-14 MED ORDER — LACTATED RINGERS IV BOLUS
1000.0000 mL | Freq: Once | INTRAVENOUS | Status: AC
  Administered 2024-02-14: 1000 mL via INTRAVENOUS

## 2024-02-14 MED ORDER — POTASSIUM CHLORIDE 20 MEQ PO PACK
40.0000 meq | PACK | Freq: Once | ORAL | Status: AC
  Administered 2024-02-14: 40 meq via ORAL
  Filled 2024-02-14: qty 2

## 2024-02-14 MED ORDER — IOHEXOL 350 MG/ML SOLN
100.0000 mL | Freq: Once | INTRAVENOUS | Status: AC | PRN
  Administered 2024-02-14: 100 mL via INTRAVENOUS

## 2024-02-14 NOTE — ED Provider Notes (Signed)
 "  [facility] Provider Note   Event Date/Time   First MD Initiated Contact with Patient 02/14/24 2158     (approximate) History  Emesis and Loss of Consciousness  HPI Larry Conner is a 54 y.o. male with past medical history of GERD, IBS, BPH, and tobacco abuse who presents via EMS after an episode of syncope and nausea/vomiting.  Patient states that he was having a drink with his wife this evening when he began to feel extremely nauseous, vomited and then became extremely lightheaded describing tunnel vision and diaphoresis.  When EMS arrived, patient's blood pressure was 90s over 50s and heart rate in the 70s.  Patient received 4 mg of Zofran  and route with continued vomiting.  Patient endorses mild abdominal pain that is worse with vomiting in the epigastric and right upper quadrant regions ROS: Patient currently denies any vision changes, tinnitus, difficulty speaking, facial droop, sore throat, chest pain, shortness of breath, diarrhea, dysuria, or weakness/numbness/paresthesias in any extremity   Physical Exam  Triage Vital Signs: ED Triage Vitals  Encounter Vitals Group     BP      Girls Systolic BP Percentile      Girls Diastolic BP Percentile      Boys Systolic BP Percentile      Boys Diastolic BP Percentile      Pulse      Resp      Temp      Temp src      SpO2      Weight      Height      Head Circumference      Peak Flow      Pain Score      Pain Loc      Pain Education      Exclude from Growth Chart    Most recent vital signs: Vitals:   02/14/24 2230  BP: 102/65  Pulse: 71  Resp: 14  Temp: 97.8 F (36.6 C)  SpO2: 97%   General: Awake, oriented x4. CV:  Good peripheral perfusion. Resp:  Normal effort. Abd:  No distention. Other:  Middle-aged obese Caucasian male resting comfortably in no acute distress ED Results / Procedures / Treatments  Labs (all labs ordered are listed, but only abnormal results are displayed) Labs Reviewed   COMPREHENSIVE METABOLIC PANEL WITH GFR - Abnormal; Notable for the following components:      Result Value   Potassium 3.4 (*)    Glucose, Bld 150 (*)    BUN 25 (*)    Creatinine, Ser 1.35 (*)    AST 14 (*)    All other components within normal limits  CBC WITH DIFFERENTIAL/PLATELET - Abnormal; Notable for the following components:   WBC 12.2 (*)    Neutro Abs 9.4 (*)    All other components within normal limits  LIPASE, BLOOD  TROPONIN T, HIGH SENSITIVITY   EKG ED ECG REPORT I, Artist MARLA Kerns, the attending physician, personally viewed and interpreted this ECG. Date: 02/14/2024 EKG Time: 2203 Rate: 76 Rhythm: normal sinus rhythm QRS Axis: normal Intervals: normal ST/T Wave abnormalities: normal Narrative Interpretation: no evidence of acute ischemia RADIOLOGY ED MD interpretation: Pending - All radiology independently interpreted and agree with radiology assessment Official radiology report(s): No results found. PROCEDURES: Critical Care performed: No Procedures MEDICATIONS ORDERED IN ED: Medications  ondansetron  (ZOFRAN ) injection 4 mg (4 mg Intravenous Given 02/14/24 2207)  lactated ringers  bolus 1,000 mL (1,000 mLs Intravenous New Bag/Given 02/14/24 2209)  iohexol  (OMNIPAQUE ) 350 MG/ML injection 100 mL (100 mLs Intravenous Contrast Given 02/14/24 2306)   IMPRESSION / MDM / ASSESSMENT AND PLAN / ED COURSE  I reviewed the triage vital signs and the nursing notes.                             The patient is on the cardiac monitor to evaluate for evidence of arrhythmia and/or significant heart rate changes. Patient's presentation is most consistent with acute presentation with potential threat to life or bodily function. Patient is a 54 year old male with the above-stated past medical history presents complaining of nausea/vomiting/syncope DDx: Vasovagal syncope, medication side effect, arrhythmia, ACS, biliary disease, appendicitis Plan: CBC, CMP, lipase, EKG, troponin,  CT abdomen pelvis  Care of this patient will be signed out to the oncoming physician at the end of my shift.  All pertinent patient information conveyed and all questions answered.  All further care and disposition decisions will be made by the oncoming physician.   FINAL CLINICAL IMPRESSION(S) / ED DIAGNOSES   Final diagnoses:  Nausea and vomiting, unspecified vomiting type   Rx / DC Orders   ED Discharge Orders     None      Note:  This document was prepared using Dragon voice recognition software and may include unintentional dictation errors.   Kayra Crowell K, MD 02/14/24 2314  "

## 2024-02-14 NOTE — ED Provider Notes (Signed)
 11:43 PM  Assumed care at shift change.  Patient here after near syncopal event, vomiting.  CT imaging pending at time of signout.  CT reviewed and interpreted by myself and radiologist and shows no acute abnormality.  On reassessment patient states he is feeling back to baseline and ready for discharge.  He denies having any chest pain, shortness of breath.  His abdominal exam is benign.  Potassium level slightly low at 3.4.  Will replace and p.o. challenge.  Suspect near syncope likely vasovagal in nature.  Doubt ACS, PE, dissection, arrhythmia.  Will discharge with PCP referral.  At this time, I do not feel there is any life-threatening condition present. I reviewed all nursing notes, vitals, pertinent previous records.  All lab and urine results, EKGs, imaging ordered have been independently reviewed and interpreted by myself.  I reviewed all available radiology reports from any imaging ordered this visit.  Based on my assessment, I feel the patient is safe to be discharged home without further emergent workup and can continue workup as an outpatient as needed. Discussed all findings, treatment plan as well as usual and customary return precautions.  They verbalize understanding and are comfortable with this plan.  Outpatient follow-up has been provided as needed.  All questions have been answered.    Armoni Kludt, Josette SAILOR, DO 02/14/24 2346

## 2024-02-14 NOTE — ED Triage Notes (Signed)
 Pt arrived via [facility] from local bar where he reported consuming 1 alcoholic drink and then had a near-syncopal episode. EMS reported pt having N/V and diaphoretic in route. Pt received 4mg  Zofran  prior to arrival to ED.

## 2024-02-14 NOTE — ED Notes (Signed)
 Pt transported to CT ?
